# Patient Record
Sex: Male | Born: 1950 | Race: Black or African American | Hispanic: No | Marital: Married | Smoking: Never smoker
Health system: Southern US, Community
[De-identification: ages and names within clinical notes are randomized; demographics above are authoritative.]

## PROBLEM LIST (undated history)

## (undated) DIAGNOSIS — E119 Type 2 diabetes mellitus without complications: Secondary | ICD-10-CM

---

## 2017-12-22 ENCOUNTER — Encounter: Payer: Self-pay | Admitting: Emergency Medicine

## 2017-12-22 ENCOUNTER — Inpatient Hospital Stay
Admission: EM | Admit: 2017-12-22 | Discharge: 2017-12-25 | DRG: 563 | Disposition: A | Discharge status: Other Acute Inpatient Hospital | Payer: Medicare Other | Attending: Orthopedic Surgery | Admitting: Orthopedic Surgery

## 2017-12-22 ENCOUNTER — Inpatient Hospital Stay: Payer: Medicare Other

## 2017-12-22 ENCOUNTER — Emergency Department: Payer: Medicare Other

## 2017-12-22 ENCOUNTER — Other Ambulatory Visit: Payer: Self-pay

## 2017-12-22 DIAGNOSIS — Z01810 Encounter for preprocedural cardiovascular examination: Secondary | ICD-10-CM | POA: Diagnosis not present

## 2017-12-22 DIAGNOSIS — S82102A Unspecified fracture of upper end of left tibia, initial encounter for closed fracture: Principal | ICD-10-CM | POA: Diagnosis present

## 2017-12-22 DIAGNOSIS — S82142A Displaced bicondylar fracture of left tibia, initial encounter for closed fracture: Secondary | ICD-10-CM | POA: Diagnosis present

## 2017-12-22 DIAGNOSIS — S82402A Unspecified fracture of shaft of left fibula, initial encounter for closed fracture: Secondary | ICD-10-CM

## 2017-12-22 DIAGNOSIS — Z7984 Long term (current) use of oral hypoglycemic drugs: Secondary | ICD-10-CM | POA: Diagnosis not present

## 2017-12-22 DIAGNOSIS — E119 Type 2 diabetes mellitus without complications: Secondary | ICD-10-CM | POA: Diagnosis present

## 2017-12-22 DIAGNOSIS — Z880 Allergy status to penicillin: Secondary | ICD-10-CM | POA: Diagnosis not present

## 2017-12-22 DIAGNOSIS — S82202A Unspecified fracture of shaft of left tibia, initial encounter for closed fracture: Secondary | ICD-10-CM

## 2017-12-22 DIAGNOSIS — Z79899 Other long term (current) drug therapy: Secondary | ICD-10-CM | POA: Diagnosis not present

## 2017-12-22 HISTORY — DX: Type 2 diabetes mellitus without complications: E11.9

## 2017-12-22 LAB — CBC WITH DIFFERENTIAL/PLATELET
Basophils Absolute: 0.1 10*3/uL (ref 0–0.1)
Basophils Relative: 1 %
Eosinophils Absolute: 0 10*3/uL (ref 0–0.7)
Eosinophils Relative: 1 %
HEMATOCRIT: 41.7 % (ref 40.0–52.0)
Hemoglobin: 13.8 g/dL (ref 13.0–18.0)
LYMPHS PCT: 17 %
Lymphs Abs: 1.3 10*3/uL (ref 1.0–3.6)
MCH: 25.8 pg — AB (ref 26.0–34.0)
MCHC: 33.1 g/dL (ref 32.0–36.0)
MCV: 77.9 fL — AB (ref 80.0–100.0)
MONO ABS: 0.4 10*3/uL (ref 0.2–1.0)
MONOS PCT: 5 %
Neutro Abs: 5.9 10*3/uL (ref 1.4–6.5)
Neutrophils Relative %: 76 %
Platelets: 185 10*3/uL (ref 150–440)
RBC: 5.35 MIL/uL (ref 4.40–5.90)
RDW: 14.6 % — AB (ref 11.5–14.5)
WBC: 7.8 10*3/uL (ref 3.8–10.6)

## 2017-12-22 LAB — BASIC METABOLIC PANEL
Anion gap: 10 (ref 5–15)
BUN: 13 mg/dL (ref 8–23)
CALCIUM: 9.1 mg/dL (ref 8.9–10.3)
CO2: 26 mmol/L (ref 22–32)
Chloride: 102 mmol/L (ref 98–111)
Creatinine, Ser: 0.99 mg/dL (ref 0.61–1.24)
GFR calc non Af Amer: >60 mL/min (ref >60)
Glucose, Bld: 153 mg/dL — ABNORMAL HIGH (ref 70–99)
Potassium: 4.2 mmol/L (ref 3.5–5.1)
SODIUM: 138 mmol/L (ref 135–145)

## 2017-12-22 LAB — GLUCOSE, CAPILLARY
GLUCOSE-CAPILLARY: 161 mg/dL — AB (ref 70–99)
Glucose-Capillary: 102 mg/dL — ABNORMAL HIGH (ref 70–99)

## 2017-12-22 LAB — SURGICAL PCR SCREEN
MRSA, PCR: NEGATIVE
Staphylococcus aureus: POSITIVE — AB

## 2017-12-22 LAB — URINALYSIS, ROUTINE W REFLEX MICROSCOPIC
Bilirubin Urine: NEGATIVE
GLUCOSE, UA: NEGATIVE mg/dL
HGB URINE DIPSTICK: NEGATIVE
Ketones, ur: NEGATIVE mg/dL
Leukocytes, UA: NEGATIVE
Nitrite: NEGATIVE
PH: 6 (ref 5.0–8.0)
Protein, ur: NEGATIVE mg/dL
Specific Gravity, Urine: 1.018 (ref 1.005–1.030)

## 2017-12-22 LAB — TYPE AND SCREEN
ABO/RH(D): B POS
Antibody Screen: NEGATIVE

## 2017-12-22 MED ORDER — INFLUENZA VAC SPLIT HIGH-DOSE 0.5 ML IM SUSY
0.5000 mL | PREFILLED_SYRINGE | INTRAMUSCULAR | Status: DC
  Filled 2017-12-22: qty 0.5

## 2017-12-22 MED ORDER — LACTATED RINGERS IV SOLN
INTRAVENOUS | Status: DC
  Administered 2017-12-22 – 2017-12-25 (×5): via INTRAVENOUS

## 2017-12-22 MED ORDER — DOCUSATE SODIUM 100 MG PO CAPS
100.0000 mg | ORAL_CAPSULE | Freq: Two times a day (BID) | ORAL | Status: DC
  Administered 2017-12-22 – 2017-12-24 (×5): 100 mg via ORAL
  Filled 2017-12-22 (×5): qty 1

## 2017-12-22 MED ORDER — MAGNESIUM CITRATE PO SOLN
1.0000 | Freq: Once | ORAL | Status: DC | PRN
  Filled 2017-12-22: qty 296

## 2017-12-22 MED ORDER — MORPHINE SULFATE (PF) 2 MG/ML IV SOLN
0.5000 mg | INTRAVENOUS | Status: DC | PRN
  Administered 2017-12-23 – 2017-12-24 (×3): 0.5 mg via INTRAVENOUS
  Filled 2017-12-22 (×3): qty 1

## 2017-12-22 MED ORDER — FAMOTIDINE 20 MG PO TABS
10.0000 mg | ORAL_TABLET | Freq: Every day | ORAL | Status: DC
  Administered 2017-12-23 – 2017-12-24 (×2): 10 mg via ORAL
  Filled 2017-12-22 (×2): qty 1

## 2017-12-22 MED ORDER — INSULIN ASPART 100 UNIT/ML ~~LOC~~ SOLN
0.0000 [IU] | Freq: Three times a day (TID) | SUBCUTANEOUS | Status: DC
  Administered 2017-12-23 – 2017-12-24 (×6): 2 [IU] via SUBCUTANEOUS
  Administered 2017-12-25: 3 [IU] via SUBCUTANEOUS
  Filled 2017-12-22 (×7): qty 1

## 2017-12-22 MED ORDER — BISACODYL 10 MG RE SUPP: 10.0000 mg | Freq: Every day | RECTAL | Status: DC | PRN

## 2017-12-22 MED ORDER — CHLORHEXIDINE GLUCONATE CLOTH 2 % EX PADS
6.0000 | MEDICATED_PAD | Freq: Every day | CUTANEOUS | Status: DC
  Administered 2017-12-23 – 2017-12-24 (×2): 6 via TOPICAL

## 2017-12-22 MED ORDER — SENNA 8.6 MG PO TABS
1.0000 | ORAL_TABLET | Freq: Two times a day (BID) | ORAL | Status: DC
  Administered 2017-12-22 – 2017-12-24 (×5): 8.6 mg via ORAL
  Filled 2017-12-22 (×6): qty 1

## 2017-12-22 MED ORDER — AMLODIPINE BESYLATE 10 MG PO TABS
10.0000 mg | ORAL_TABLET | Freq: Every day | ORAL | Status: DC
  Administered 2017-12-23 – 2017-12-25 (×3): 10 mg via ORAL
  Filled 2017-12-22 (×3): qty 1

## 2017-12-22 MED ORDER — MUPIROCIN 2 % EX OINT
1.0000 "application " | TOPICAL_OINTMENT | Freq: Two times a day (BID) | CUTANEOUS | Status: DC
  Administered 2017-12-23 – 2017-12-24 (×3): 1 via NASAL
  Filled 2017-12-22 (×2): qty 22

## 2017-12-22 MED ORDER — METFORMIN HCL 500 MG PO TABS
500.0000 mg | ORAL_TABLET | Freq: Two times a day (BID) | ORAL | Status: DC
  Administered 2017-12-22 – 2017-12-25 (×6): 500 mg via ORAL
  Filled 2017-12-22 (×8): qty 1

## 2017-12-22 MED ORDER — CLINDAMYCIN PHOSPHATE 900 MG/50ML IV SOLN
900.0000 mg | INTRAVENOUS | Status: DC
  Filled 2017-12-22: qty 50

## 2017-12-22 MED ORDER — HYDROCODONE-ACETAMINOPHEN 5-325 MG PO TABS
1.0000 | ORAL_TABLET | Freq: Four times a day (QID) | ORAL | Status: DC | PRN
  Administered 2017-12-22 – 2017-12-25 (×11): 2 via ORAL
  Filled 2017-12-22 (×11): qty 2

## 2017-12-22 MED ORDER — HYDROMORPHONE HCL 1 MG/ML IJ SOLN
1.0000 mg | Freq: Once | INTRAMUSCULAR | Status: AC
  Administered 2017-12-22: 1 mg via INTRAMUSCULAR
  Filled 2017-12-22: qty 1

## 2017-12-22 NOTE — ED Provider Notes (Signed)
[facility] Emergency Department Provider Note   ____________________________________________   First MD Initiated Contact with Patient 12/22/17 1027     (approximate)  I have reviewed the triage vital signs and the nursing notes.   HISTORY  Chief Complaint Knee Pain    HPI George Booker is a 67 y.o. male patient arrived via EMS complaining of left knee pain secondary to rolled out of a golf cart.  Patient unable to bear light weight.  Patient rates his pain as a 8/10.  There is mild edema inferior aspect of the patella.  Past Medical History:  Diagnosis Date  . Diabetes mellitus without complication [facility])     Patient Active Problem List   Diagnosis Date Noted  . Tibial plateau fracture, left, closed, initial encounter 12/22/2017  . Fracture of left tibial plateau 12/22/2017    History reviewed. No pertinent surgical history.  Prior to Admission medications   Medication Sig Start Date End Date Taking? Authorizing Provider  amLODipine (NORVASC) 10 MG tablet Take 10 mg by mouth daily.   Yes [provider]  metFORMIN (GLUCOPHAGE) 500 MG tablet Take by mouth 2 (two) times daily with a meal.   Yes [provider]  ranitidine (ZANTAC) 150 MG tablet Take 150 mg by mouth 2 (two) times daily.   Yes [provider]    Allergies Penicillins  History reviewed. No pertinent family history.  Social History Social History   Tobacco Use  . Smoking status: Never Smoker  . Smokeless tobacco: Never Used  Substance Use Topics  . Alcohol use: Not on file  . Drug use: Not on file    Review of Systems  Constitutional: No fever/chills Eyes: No visual changes. ENT: No sore throat. Cardiovascular: Denies chest pain. Respiratory: Denies shortness of breath. Gastrointestinal: No abdominal pain.  No nausea, no vomiting.  No diarrhea.  No constipation. Genitourinary: Negative for dysuria. Musculoskeletal: Left knee  pain.. Skin: Negative for rash. Neurological: Negative for headaches, focal weakness or numbness. Endocrine:Diabetes and hypertension. Allergic/Immunilogical: Penicillin ____________________________________________   PHYSICAL EXAM:  VITAL SIGNS: ED Triage Vitals  Enc Vitals Group     BP 12/22/17 1011 (!) 144/82     Pulse Rate 12/22/17 1011 69     Resp 12/22/17 1011 20     Temp 12/22/17 1011 98.1 F (36.7 C)     Temp Source 12/22/17 1011 Oral     SpO2 12/22/17 1010 97 %     Weight 12/22/17 1011 216 lb (98 kg)     Height 12/22/17 1011 5\' 7"  (1.702 m)     Head Circumference --      Peak Flow --      Pain Score 12/22/17 1011 8     Pain Loc --      Pain Edu? --      Excl. in GC? --    Constitutional: Alert and oriented. Well appearing and in no acute distress. Neck: No cervical spine tenderness to palpation. Hematological/Lymphatic/Immunilogical No cervical lymphadenopathy. Cardiovascular: Normal rate, regular rhythm. Grossly normal heart sounds.  Good peripheral circulation. Respiratory: Normal respiratory effort.  No retractions. Lungs CTAB. Gastrointestinal: Soft and nontender. No distention. No abdominal bruits. No CVA tenderness. Musculoskeletal: No obvious deformity to the left knee.  Moderate edema.  Patient is tender to palpation in the tibial tuberosity. Neurologic:  Normal speech and language. No gross focal neurologic deficits are appreciated. No gait instability. Skin:  Skin is warm, dry and intact. No rash noted. Psychiatric: Mood  and affect are normal. Speech and behavior are normal.  ____________________________________________   LABS (all labs ordered are listed, but only abnormal results are displayed)  Labs Reviewed  CBC WITH DIFFERENTIAL/PLATELET - Abnormal; Notable for the following components:      Result Value   MCV 77.9 (*)    MCH 25.8 (*)    RDW 14.6 (*)    All other components within normal limits  BASIC METABOLIC PANEL    ____________________________________________  EKG   ____________________________________________  RADIOLOGY  ED MD interpretation:    Official radiology report(s): Dg Knee Complete 4 Views Left  Result Date: 12/22/2017 CLINICAL DATA:  Left knee pain after falling out of golf cart. EXAM: LEFT KNEE - COMPLETE 4+ VIEW COMPARISON:  None. FINDINGS: Severely displaced and comminuted fracture is seen involving the lateral tibial plateau. Mildly displaced proximal left fibular fracture is noted. Patella spurring is noted. Fat fluid level is noted in the suprapatellar bursa consistent with fracture. IMPRESSION: Severely displaced and comminuted lateral tibial plateau fracture. Mildly displaced proximal left fibular fracture. Electronically Signed   By: Lupita Raider, M.D.   On: 12/22/2017 10:51    ____________________________________________   PROCEDURES  Procedure(s) performed: None  Procedures  Critical Care performed: No  ____________________________________________   INITIAL IMPRESSION / ASSESSMENT AND PLAN / ED COURSE  As part of my medical decision making, I reviewed the following data within the electronic MEDICAL RECORD NUMBER    Left knee pain secondary to proximal tib-fib fracture.  Orthopedic has been consulted and patient admitted for surgery.   ____________________________________________   FINAL CLINICAL IMPRESSION(S) / ED DIAGNOSES  Final diagnoses:  Tibia/fibula fracture, left, closed, initial encounter     ED Discharge Orders    None       Note:  This document was prepared using Dragon voice recognition software and may include unintentional dictation errors.    Joni Reining, PA-C 12/22/17 1317    Emily Filbert, MD 12/22/17 [phone]

## 2017-12-22 NOTE — ED Triage Notes (Signed)
Pt arrived via EMS with left knee pain after rolling out of golf cart.

## 2017-12-22 NOTE — Progress Notes (Signed)
Received order from Dr. Odis Luster to use polar care to left knee instead of ice pack

## 2017-12-22 NOTE — ED Notes (Signed)
FIRST NURSE NOTE:  Pt arrived via EMS after rolling out of golf cart on the left knee, swelling noted to knee, was given a total of 100 mcg of Fentanyl IM with EMS 50 mcg at 942 and at 949.   Unable to get IV access, BP 181/98, p-63, RR-17, 97% RA.

## 2017-12-22 NOTE — Progress Notes (Signed)
Surgical MRSA PCR sent  

## 2017-12-22 NOTE — Clinical Social Work Note (Addendum)
Clinical Social Work Assessment  Patient Details  Name: George Booker MRN: [id number] Date of Birth: Nov 03, 1950  Date of referral:  12/22/17               Reason for consult:  Facility Placement                Permission sought to share information with:  Family Supports, Customer service manager Permission granted to share information::  Yes, Verbal Permission Granted  Name::     Spouse wife George Booker [phone]  Agency::  All facilities  Relationship::     Contact Information:     Housing/Transportation Living arrangements for the past 2 months:  [place] of Information:  Patient Patient Interpreter Needed:  None Criminal Activity/Legal Involvement Pertinent to Current Situation/Hospitalization:  No - Comment as needed Significant Relationships:  None Lives with:  Adult Children, Minor Children, Spouse Do you feel safe going back to the place where you live?  Yes Need for family participation in patient care:  Yes (Comment)  Care giving concerns:  TBD- Patient has no concerns at this time  Facilities manager / plan: LCSW introduced myself to patient and obtained verbal consent to speak to spouse and is agreeable to either return home with home health or go to SNF for short term rehab. He will await his surgeons opinion and family discussion. He is married and has a daughter and grand daughter living with him and his wife ( excellent family support.) Patient will require assistance with most of his ADLs after surgery with the exception of feeding himself. He is medicare and [place] benefits. He has Tibeal plateau fracture on his left knee and will need support  And obtainPT and OT after his surgery. Patient will follow the surgeons recommendations. LCSW completed assessment and Fl2 started and obtained a Passr number in case he elects to go to facility. He wants to talk to home health as well. This would be his first option.( to go home) patient is  diabetic and takes met formin    Employment status:  Retired(Retired Retail banker and Physiological scientist) Insurance information:  Medicare, [place] Benefit PT Recommendations:  Not assessed at this time Information / Referral to community resources:  [place]  Patient/Family's Response to care:  Good understanding of process  Patient/Family's Understanding of and Emotional Response to Diagnosis, Current Treatment, and Prognosis:  He has very good understanding and patient requested 2 warm blankets which LCSW provided.  Emotional Assessment Appearance:  Appears stated age Attitude/Demeanor/Rapport:  Engaged, Charismatic Affect (typically observed):  Accepting, Calm Orientation:  Oriented to Self, Oriented to Place, Oriented to  Time, Oriented to Situation Alcohol / Substance use:  Not Applicable Psych involvement (Current and /or in the community):  No (Comment)  Discharge Needs  Concerns to be addressed:  No discharge needs identified Readmission within the last 30 days:  No Current discharge risk:  None Barriers to Discharge:  Continued Medical Work up   [place], [place], LCSW 12/22/2017, 1:35 PM

## 2017-12-22 NOTE — ED Notes (Signed)
See triage note  Presents with left knee pain  States he rolled out of golf cart  Swelling

## 2017-12-22 NOTE — ED Notes (Signed)
Pt transported to 156

## 2017-12-22 NOTE — Progress Notes (Signed)
LCSW went to speak to patient about his future medical needs and was informed by ED nurse that he was at CT. He is going to be admitted to room 156. LCSW will follow patient and complete assessment.  [employer] LCSW [phone]

## 2017-12-22 NOTE — NC FL2 (Signed)
  [place] MEDICAID FL2 LEVEL OF CARE SCREENING TOOL     IDENTIFICATION  Patient Name: George Booker Birthdate: Jul 22, 1950 Sex: male Admission Date (Current Location): 12/22/2017  [place] and [place] Number:  Chiropodist and Address:  [facility], [address], [place], [place] [address]      Provider Number: [id number]  Attending Physician Name and Address:  Lyndle Herrlich, MD  Relative Name and Phone Number:     Jennie Bolar  [phone] ( spouse)  Current Level of Care: Hospital Recommended Level of Care: Skilled Nursing Facility Prior Approval Number:    Date Approved/Denied:   PASRR Number: [id number]  Discharge Plan: SNF    Current Diagnoses: Patient Active Problem List   Diagnosis Date Noted  . Tibial plateau fracture, left, closed, initial encounter 12/22/2017  . Fracture of left tibial plateau 12/22/2017    Orientation RESPIRATION BLADDER Height & Weight     Self, Time, Situation, Place  Normal Continent Weight: 216 lb (98 kg) Height:  5\' 7"  (170.2 cm)  BEHAVIORAL SYMPTOMS/MOOD NEUROLOGICAL BOWEL NUTRITION STATUS      Continent Diet(Diabetic)  AMBULATORY STATUS COMMUNICATION OF NEEDS Skin   Extensive Assist Verbally (S) (After surgery may have surgical wounds)                       Personal Care Assistance Level of Assistance  Bathing, Feeding, Dressing, Total care Bathing Assistance: Limited assistance Feeding assistance: Limited assistance Dressing Assistance: Limited assistance Total Care Assistance: Limited assistance   Functional Limitations Info  Sight, Hearing, Speech Sight Info: Adequate Hearing Info: Adequate Speech Info: Adequate    SPECIAL CARE FACTORS FREQUENCY  PT (By licensed PT), OT (By licensed OT)     PT Frequency: x5 OT Frequency: x5            Contractures Contractures Info: Not present    Additional Factors Info  Code Status, Allergies Code Status Info: Full  code Allergies Info: Penicillins           Current Medications (12/22/2017):  This is the current hospital active medication list Current Facility-Administered Medications  Medication Dose Route Frequency Provider Last Rate Last Dose  . HYDROcodone-acetaminophen (NORCO/VICODIN) 5-325 MG per tablet 1-2 tablet  1-2 tablet Oral Q6H PRN Lyndle Herrlich, MD   2 tablet at 12/22/17 1316  . morphine 2 MG/ML injection 0.5 mg  0.5 mg Intravenous Q2H PRN Lyndle Herrlich, MD       Current Outpatient Medications  Medication Sig Dispense Refill  . amLODipine (NORVASC) 10 MG tablet Take 10 mg by mouth daily.    . metFORMIN (GLUCOPHAGE) 500 MG tablet Take by mouth 2 (two) times daily with a meal.    . ranitidine (ZANTAC) 150 MG tablet Take 150 mg by mouth 2 (two) times daily.       Discharge Medications: Please see discharge summary for a list of discharge medications.  Relevant Imaging Results:  Relevant Lab Results:   Additional Information SSN [id number]  Cheron Schaumann, [place]

## 2017-12-22 NOTE — Progress Notes (Signed)
ADMISSION NOTE: Pt. admitted to unit 156 from ED, Oriented to room, call bell, Ascom phones and staff. Bed in low position. Fall safety plan reviewed, non-skid socks in place, bed alarm on. Full assessment to Epic; skin assessed with Marijean Niemann, RN .Will continue to monitor.

## 2017-12-22 NOTE — Progress Notes (Signed)
Pt has a history of diabetes, no sliding scale orders. MD Odis Luster notified and placing orders. Pts BP in the 160's MD also notified. No orders received.

## 2017-12-23 ENCOUNTER — Encounter
Admission: EM | Disposition: A | Discharge status: Other Acute Inpatient Hospital | Payer: Self-pay | Source: Home / Self Care | Attending: Orthopedic Surgery

## 2017-12-23 LAB — URINE CULTURE: Culture: NO GROWTH

## 2017-12-23 LAB — GLUCOSE, CAPILLARY
Glucose-Capillary: 133 mg/dL — ABNORMAL HIGH (ref 70–99)
Glucose-Capillary: 137 mg/dL — ABNORMAL HIGH (ref 70–99)
Glucose-Capillary: 135 mg/dL — ABNORMAL HIGH (ref 70–99)
Glucose-Capillary: 149 mg/dL — ABNORMAL HIGH (ref 70–99)

## 2017-12-23 LAB — HIV ANTIBODY (ROUTINE TESTING W REFLEX): HIV SCREEN 4TH GENERATION: NONREACTIVE

## 2017-12-23 SURGERY — OPEN REDUCTION INTERNAL FIXATION (ORIF) TIBIAL PLATEAU
Anesthesia: Choice | Laterality: Left

## 2017-12-23 NOTE — Progress Notes (Signed)
Patient desires to be transferred to the [facility] for treatment. Orders placed.

## 2017-12-23 NOTE — H&P (Signed)
PREOPERATIVE H&P  Chief Complaint: left tibial plateau fracture  HPI: George Booker is a 67 y.o. male who presents for preoperative history and physical with a diagnosis of left tibial plateau fracture. Symptoms are rated as moderate to severe, and have been worsening.  This is significantly impairing activities of daily living.  He has elected for surgical management.   Past Medical History:  Diagnosis Date  . Diabetes mellitus without complication (HCC)    History reviewed. No pertinent surgical history. Social History   Socioeconomic History  . Marital status: Married    Spouse name: Not on file  . Number of children: Not on file  . Years of education: Not on file  . Highest education level: Not on file  Occupational History  . Not on file  Social Needs  . Financial resource strain: Not on file  . Food insecurity:    Worry: Not on file    Inability: Not on file  . Transportation needs:    Medical: Not on file    Non-medical: Not on file  Tobacco Use  . Smoking status: Never Smoker  . Smokeless tobacco: Never Used  Substance and Sexual Activity  . Alcohol use: Not on file  . Drug use: Not on file  . Sexual activity: Not on file  Lifestyle  . Physical activity:    Days per week: Not on file    Minutes per session: Not on file  . Stress: Not on file  Relationships  . Social connections:    Talks on phone: Not on file    Gets together: Not on file    Attends religious service: Not on file    Active member of club or organization: Not on file    Attends meetings of clubs or organizations: Not on file    Relationship status: Not on file  Other Topics Concern  . Not on file  Social History Narrative  . Not on file   History reviewed. No pertinent family history. Allergies  Allergen Reactions  . Penicillins     Has patient had a PCN reaction causing immediate rash, facial/tongue/throat swelling, SOB or lightheadedness with hypotension: Unknown Has patient had a  PCN reaction causing severe rash involving mucus membranes or skin necrosis: Unknown Has patient had a PCN reaction that required hospitalization: Unknown Has patient had a PCN reaction occurring within the last 10 years: Unknown If all of the above answers are "NO", then may proceed with Cephalosporin use.   Prior to Admission medications   Medication Sig Start Date End Date Taking? Authorizing Provider  amLODipine (NORVASC) 10 MG tablet Take 10 mg by mouth daily.   Yes [provider]  ibuprofen (ADVIL,MOTRIN) 800 MG tablet Take 800 mg by mouth daily as needed.   Yes [provider]  metFORMIN (GLUCOPHAGE) 500 MG tablet Take by mouth 2 (two) times daily with a meal.   Yes [provider]  omeprazole (PRILOSEC) 20 MG capsule Take 20 mg by mouth daily.   Yes [provider]     Positive ROS: All other systems have been reviewed and were otherwise negative with the exception of those mentioned in the HPI and as above.  Physical Exam: General: Alert, no acute distress Cardiovascular: Regular rate and rhythm, no murmurs rubs or gallops.  No pedal edema Respiratory: Clear to auscultation bilaterally, no wheezes rales or rhonchi. No cyanosis, no use of accessory musculature GI: No organomegaly, abdomen is soft and non-tender nondistended with positive bowel sounds.  Skin: Skin intact, no lesions within the operative field. Neurologic: Sensation intact distally Psychiatric: Patient is competent for consent with normal mood and affect Lymphatic: No axillary or cervical lymphadenopathy  MUSCULOSKELETAL: +DF/PF/EHL, sensation intact, compartments soft, knee with medial and lateral tenderness, large effusion  Assessment: left tibial plateau fracture  Plan: Plan for Procedure(s): OPEN REDUCTION INTERNAL FIXATION (ORIF) TIBIAL PLATEAU, LEFT  I discussed the risks and benefits of surgery. The risks include but are not limited to infection, bleeding requiring  blood transfusion, nerve or blood vessel injury, joint stiffness or loss of motion, persistent pain, weakness or instability, malunion, nonunion and hardware failure and the need for further surgery. Medical risks include but are not limited to DVT and pulmonary embolism, myocardial infarction, stroke, pneumonia, respiratory failure and death. Patient understood these risks and wished to proceed.   Lyndle Herrlich, MD   12/23/2017 11:36 AM

## 2017-12-23 NOTE — Care Management (Signed)
Transfer packet given to primary RN to have Dr. Odis Luster sign then fax to [facility]. Per Steward Drone at the [facility], patient can transfer over the weekend if bed becomes available.

## 2017-12-23 NOTE — OR Nursing (Signed)
Md cancelled case. States that pt is to be transferred to the [facility] hospital.

## 2017-12-23 NOTE — Care Management (Signed)
Spoke with Steward Drone at [facility], she is to fax transfer paperwork to [facility] that needs to be completed and SIGNED by physician prior to transfer

## 2017-12-24 LAB — GLUCOSE, CAPILLARY
GLUCOSE-CAPILLARY: 150 mg/dL — AB (ref 70–99)
GLUCOSE-CAPILLARY: 142 mg/dL — AB (ref 70–99)
Glucose-Capillary: 137 mg/dL — ABNORMAL HIGH (ref 70–99)
Glucose-Capillary: 176 mg/dL — ABNORMAL HIGH (ref 70–99)

## 2017-12-24 MED ORDER — HYDRALAZINE HCL 20 MG/ML IJ SOLN
10.0000 mg | Freq: Four times a day (QID) | INTRAMUSCULAR | Status: DC | PRN
  Administered 2017-12-24 – 2017-12-25 (×2): 10 mg via INTRAVENOUS
  Filled 2017-12-24 (×2): qty 1

## 2017-12-24 NOTE — Progress Notes (Signed)
[facility] bed control called for bed request. Per Dr Odis Luster, [facility] VS denied patient but states [facility] trauma will take him. Bed request placed.

## 2017-12-24 NOTE — Progress Notes (Signed)
Dr Odis Luster to fill out [place]/Medical Necessity forms, and then we can call the [facility] transferring service for transport at [phone].

## 2017-12-24 NOTE — Progress Notes (Signed)
Katie from [place] transfer center called, they have accepted the patient. They will call back when a bed becomes available.

## 2017-12-24 NOTE — Progress Notes (Signed)
Spoke with [facility] transport and [facility] both companies are not able to transport pt to [facility].

## 2017-12-24 NOTE — Progress Notes (Signed)
Report called to [facility] [phone]).

## 2017-12-24 NOTE — Progress Notes (Signed)
Subjective:  Patient reports pain as moderate.  No other complaints.  Objective:   VITALS:   Vitals:   12/23/17 0806 12/24/17 0047 12/24/17 0431 12/24/17 0747  BP: (!) 158/99 (!) 179/105 (!) 158/86 (!) 172/85  Pulse: 78 75 73 82  Resp: 18 18  18   Temp: 98.1 F (36.7 C) 98.1 F (36.7 C)  98.7 F (37.1 C)  TempSrc: Oral Oral  Oral  SpO2: 99% 96%  95%  Weight:      Height:        PHYSICAL EXAM:  Neurovascular intact Sensation intact distally Dorsiflexion/Plantar flexion intact Compartment soft  LABS  Results for orders placed or performed during the hospital encounter of 12/22/17 (from the past 24 hour(s))  Glucose, capillary     Status: Abnormal   Collection Time: 12/23/17 12:11 PM  Result Value Ref Range   Glucose-Capillary 137 (H) 70 - 99 mg/dL   Comment 1 Notify RN   Glucose, capillary     Status: Abnormal   Collection Time: 12/23/17  4:59 PM  Result Value Ref Range   Glucose-Capillary 135 (H) 70 - 99 mg/dL   Comment 1 Notify RN   Glucose, capillary     Status: Abnormal   Collection Time: 12/23/17  9:28 PM  Result Value Ref Range   Glucose-Capillary 149 (H) 70 - 99 mg/dL  Glucose, capillary     Status: Abnormal   Collection Time: 12/24/17  7:49 AM  Result Value Ref Range   Glucose-Capillary 137 (H) 70 - 99 mg/dL   Comment 1 Notify RN     Ct Knee Left Wo Contrast  Result Date: 12/22/2017 CLINICAL DATA:  Status post fall golf cart. EXAM: CT OF THE LEFT KNEE WITHOUT CONTRAST TECHNIQUE: Multidetector CT imaging of the LEFT knee was performed according to the standard protocol. Multiplanar CT image reconstructions were also generated. COMPARISON:  None. FINDINGS: Bones/Joint/Cartilage Acute comminuted and depressed fracture of the lateral tibial plateau with the major fracture fragment including the lateral tibial plateau displaced inferiorly 15 mm, laterally 20 mm and posteriorly 20 mm. Fracture extends into the lateral tibial evidence. Mildly comminuted fracture  of the fibular head. No other fracture or dislocation. Large lipohemarthrosis.  No aggressive osseous lesion. Mild medial femorotibial compartment joint space narrowing. Moderate lateral patellofemoral compartment joint space narrowing with subchondral cystic changes. Ligaments Ligaments are suboptimally evaluated by CT. Muscles and Tendons Muscles are normal. No muscle atrophy. Quadriceps tendon and patellar tendon are intact. Soft tissue No fluid collection or hematoma.  No soft tissue mass. IMPRESSION: 1. Acute comminuted and depressed fracture of the lateral tibial plateau with the major fracture fragment including the lateral tibial plateau displaced inferiorly 15 mm, laterally 20 mm and posteriorly 20 mm (Schatzker II). Fracture extends into the lateral tibial evidence. 2. Mildly comminuted fracture of the fibular head. Electronically Signed   By: Elige Ko   On: 12/22/2017 13:49   Dg Knee Complete 4 Views Left  Result Date: 12/22/2017 CLINICAL DATA:  Left knee pain after falling out of golf cart. EXAM: LEFT KNEE - COMPLETE 4+ VIEW COMPARISON:  None. FINDINGS: Severely displaced and comminuted fracture is seen involving the lateral tibial plateau. Mildly displaced proximal left fibular fracture is noted. Patella spurring is noted. Fat fluid level is noted in the suprapatellar bursa consistent with fracture. IMPRESSION: Severely displaced and comminuted lateral tibial plateau fracture. Mildly displaced proximal left fibular fracture. Electronically Signed   By: Lupita Raider, M.D.   On: 12/22/2017 10:51  Assessment/Plan:     Active Problems:   Tibial plateau fracture, left, closed, initial encounter   Fracture of left tibial plateau   Continue to manage pain and swelling. Plan transfer to [facility] when bed available.    Lyndle Herrlich , MD 12/24/2017, 10:13 AM

## 2017-12-25 LAB — GLUCOSE, CAPILLARY: GLUCOSE-CAPILLARY: 153 mg/dL — AB (ref 70–99)

## 2017-12-25 NOTE — Discharge Summary (Signed)
Physician Discharge Summary  Patient ID: George Booker MRN: [id number] DOB/AGE: 69/26/1952 69 y.o.  Admit date: 12/22/2017 Discharge date: 12/25/2017  Admission Diagnoses:  left tibial plateau fracture Tibial plateau fracture, left, closed, initial encounter  Discharge Diagnoses:  left tibial plateau fracture Principal Problem:   Tibial plateau fracture, left, closed, initial encounter   Past Medical History:  Diagnosis Date  . Diabetes mellitus without complication (HCC)     Surgeries: Procedure(s): none   Consultants (if any):   Discharged Condition: Improved  Hospital Course: George Booker is an 67 y.o. male who was admitted 12/22/2017 with a diagnosis of  left tibial plateau fracture Tibial plateau fracture, left, closed, initial encounter and was treated symptomatically until a bed was available for transfer to [facility] for orthopedic specialty care.     Marland Kitchen  He was given sequential compression devices for DVT prophylaxis.  He benefited maximally from the hospital stay and there were no complications.    Recent vital signs:  Vitals:   12/25/17 0050 12/25/17 0729  BP: (!) 169/90 (!) 155/98  Pulse: (!) 110 (!) 124  Resp:  18  Temp:  98.2 F (36.8 C)  SpO2:  94%    Recent laboratory studies:  Lab Results  Component Value Date   HGB 13.8 12/22/2017   Lab Results  Component Value Date   WBC 7.8 12/22/2017   PLT 185 12/22/2017   No results found for: INR Lab Results  Component Value Date   NA 138 12/22/2017   K 4.2 12/22/2017   CL 102 12/22/2017   CO2 26 12/22/2017   BUN 13 12/22/2017   CREATININE 0.99 12/22/2017   GLUCOSE 153 (H) 12/22/2017    Discharge Medications:   Allergies as of 12/25/2017      Reactions   Penicillins    Has patient had a PCN reaction causing immediate rash, facial/tongue/throat swelling, SOB or lightheadedness with hypotension: Unknown Has patient had a PCN reaction causing severe rash involving mucus membranes or skin necrosis:  Unknown Has patient had a PCN reaction that required hospitalization: Unknown Has patient had a PCN reaction occurring within the last 10 years: Unknown If all of the above answers are "NO", then may proceed with Cephalosporin use.      Medication List    STOP taking these medications   ibuprofen 800 MG tablet Commonly known as:  ADVIL,MOTRIN     TAKE these medications   amLODipine 10 MG tablet Commonly known as:  NORVASC Take 10 mg by mouth daily.   metFORMIN 500 MG tablet Commonly known as:  GLUCOPHAGE Take by mouth 2 (two) times daily with a meal.   omeprazole 20 MG capsule Commonly known as:  PRILOSEC Take 20 mg by mouth daily.       Diagnostic Studies: Ct Knee Left Wo Contrast  Result Date: 12/22/2017 CLINICAL DATA:  Status post fall golf cart. EXAM: CT OF THE LEFT KNEE WITHOUT CONTRAST TECHNIQUE: Multidetector CT imaging of the LEFT knee was performed according to the standard protocol. Multiplanar CT image reconstructions were also generated. COMPARISON:  None. FINDINGS: Bones/Joint/Cartilage Acute comminuted and depressed fracture of the lateral tibial plateau with the major fracture fragment including the lateral tibial plateau displaced inferiorly 15 mm, laterally 20 mm and posteriorly 20 mm. Fracture extends into the lateral tibial evidence. Mildly comminuted fracture of the fibular head. No other fracture or dislocation. Large lipohemarthrosis.  No aggressive osseous lesion. Mild medial femorotibial compartment joint space narrowing. Moderate lateral patellofemoral compartment joint space narrowing  with subchondral cystic changes. Ligaments Ligaments are suboptimally evaluated by CT. Muscles and Tendons Muscles are normal. No muscle atrophy. Quadriceps tendon and patellar tendon are intact. Soft tissue No fluid collection or hematoma.  No soft tissue mass. IMPRESSION: 1. Acute comminuted and depressed fracture of the lateral tibial plateau with the major fracture fragment  including the lateral tibial plateau displaced inferiorly 15 mm, laterally 20 mm and posteriorly 20 mm (Schatzker II). Fracture extends into the lateral tibial evidence. 2. Mildly comminuted fracture of the fibular head. Electronically Signed   By: Elige Ko   On: 12/22/2017 13:49   Dg Knee Complete 4 Views Left  Result Date: 12/22/2017 CLINICAL DATA:  Left knee pain after falling out of golf cart. EXAM: LEFT KNEE - COMPLETE 4+ VIEW COMPARISON:  None. FINDINGS: Severely displaced and comminuted fracture is seen involving the lateral tibial plateau. Mildly displaced proximal left fibular fracture is noted. Patella spurring is noted. Fat fluid level is noted in the suprapatellar bursa consistent with fracture. IMPRESSION: Severely displaced and comminuted lateral tibial plateau fracture. Mildly displaced proximal left fibular fracture. Electronically Signed   By: Lupita Raider, M.D.   On: 12/22/2017 10:51    Disposition: Discharge disposition: [facility] Not Defined            Signed: Lyndle Herrlich ,MD 12/25/2017, 9:46 AM

## 2017-12-25 NOTE — Progress Notes (Signed)
[facility] transport service called, no trucks available to transport patient. They transferred me to [facility], and they will be here around 815 to transport patient to [facility] Rm 2301.

## 2019-06-19 IMAGING — CT CT KNEE*L* W/O CM
3 series · 14 of 33 positions shown, 17 images · non-contrast
Comparison: None.

CLINICAL DATA: Status post fall golf cart.

EXAM:
CT OF THE LEFT KNEE WITHOUT CONTRAST
TECHNIQUE: Multidetector CT imaging of the LEFT knee was performed according to
the standard protocol. Multiplanar CT image reconstructions were
also generated.

[Series 3: axial st · axial · 0.39mm/px · z∈[+618,+750]mm · 6 of 87 slices shown, 8 images]
[im 14/87  soft-tissue]
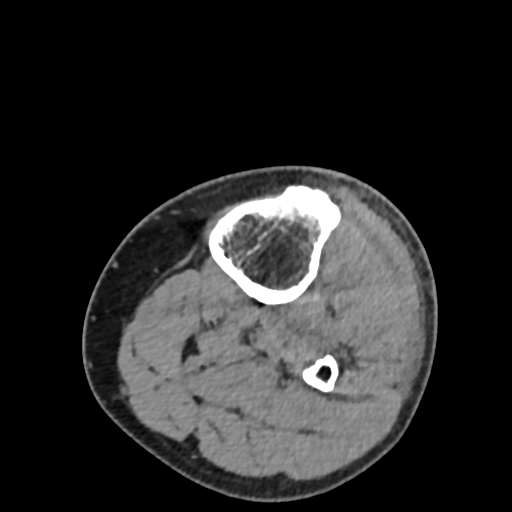
[im 14/87  bone]
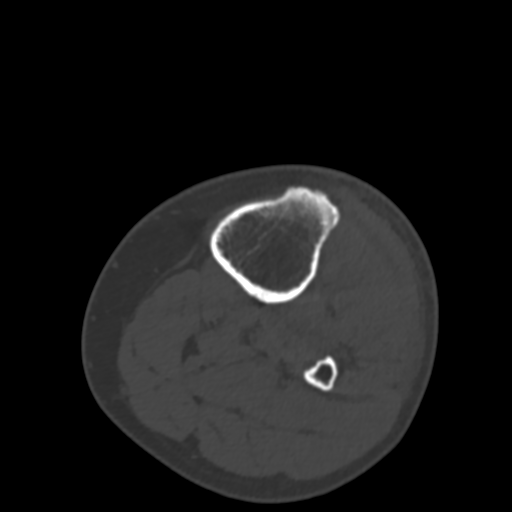
[im 27/87  bone]
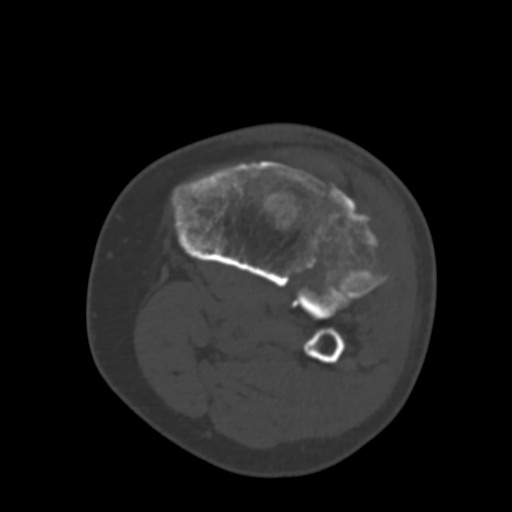
[im 40/87  bone]
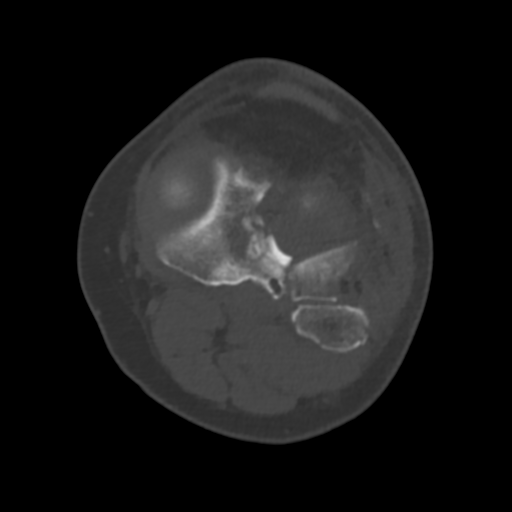
[im 53/87  bone]
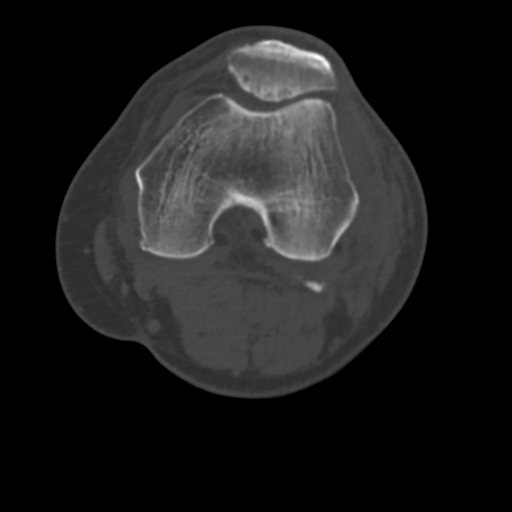
[im 67/87  soft-tissue]
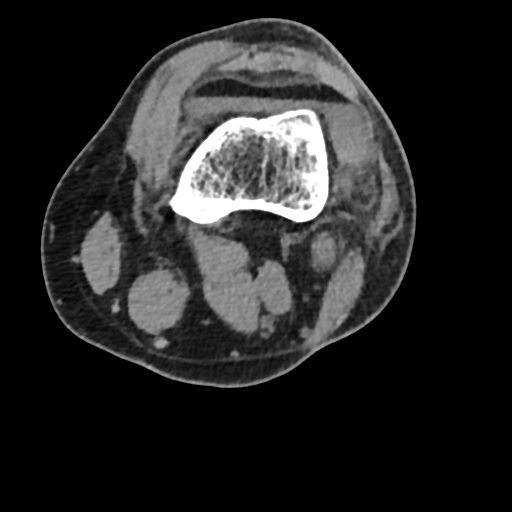
[im 67/87  bone]
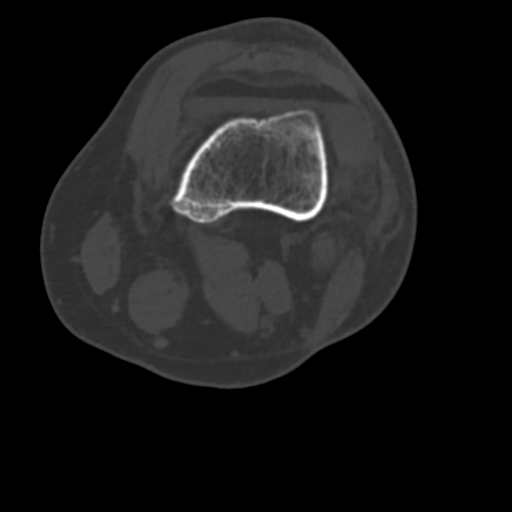
[im 80/87  bone]
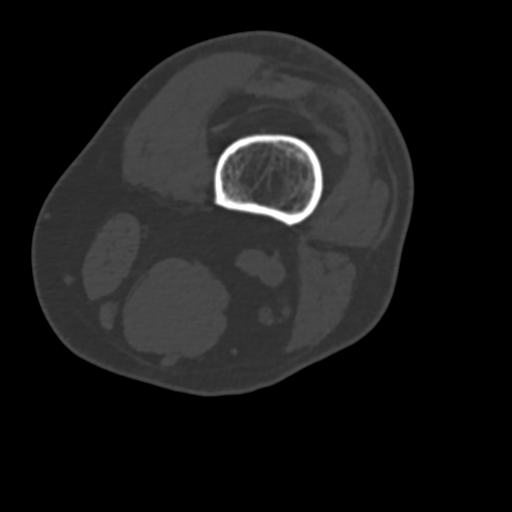

[Series 6: coronal st · coronal · 0.34mm/px · 3 of 78 slices shown]
[im 16/78  bone]
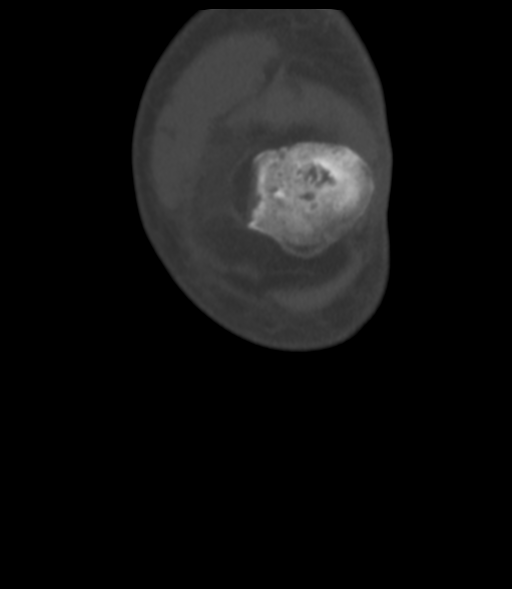
[im 31/78  bone]
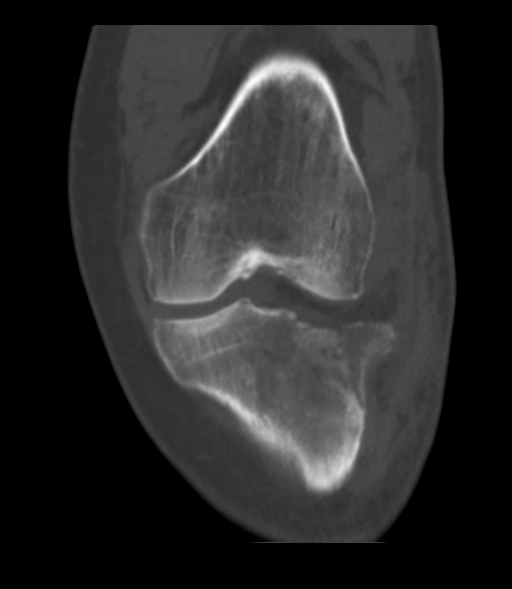
[im 47/78  bone]
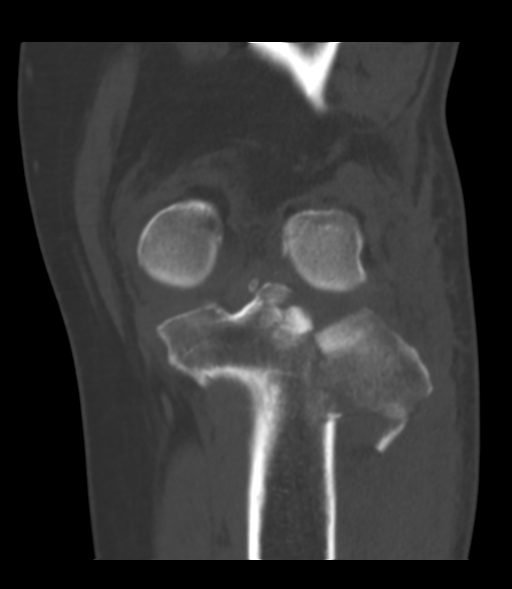

[Series 7: sagittal st · sagittal · 0.30mm/px · 5 of 72 slices shown, 6 images]
[im 24/72  bone]
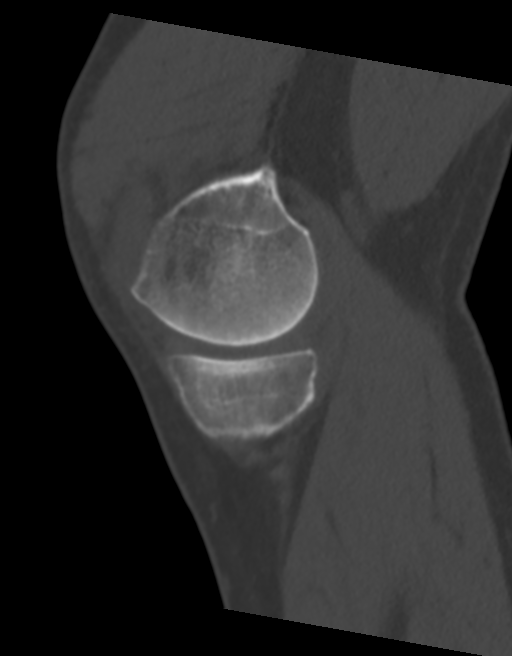
[im 30/72  bone]
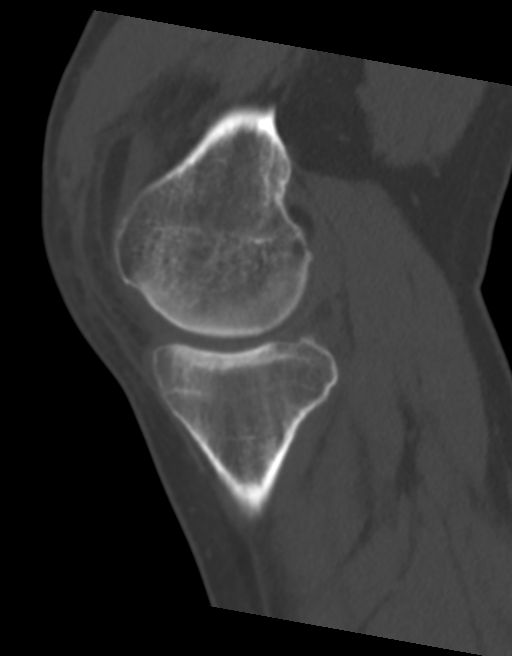
[im 36/72  soft-tissue]
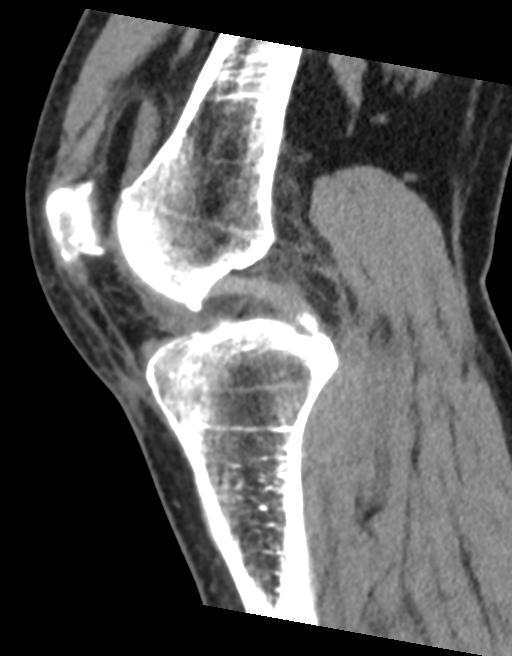
[im 36/72  bone]
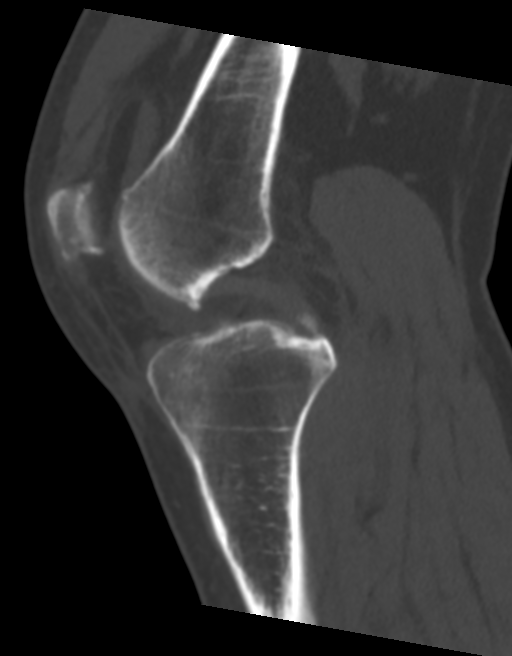
[im 42/72  bone]
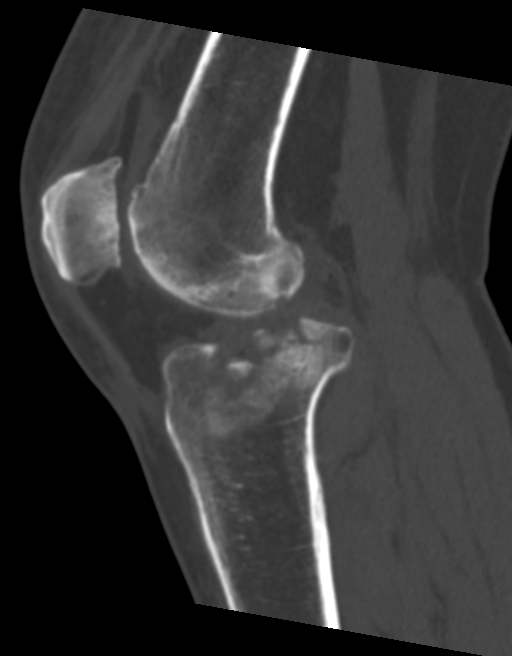
[im 48/72  bone]
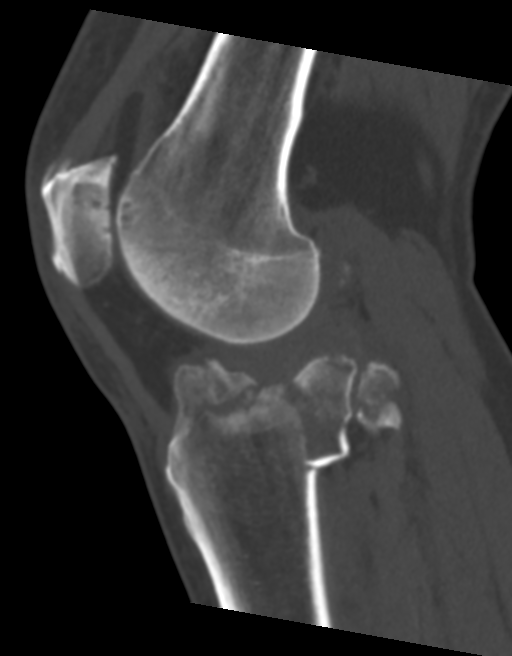

[14 of 33 positions shown; findings below may reference images not displayed]

FINDINGS: Bones/Joint/Cartilage

Acute comminuted and depressed fracture of the lateral tibial
plateau with the major fracture fragment including the lateral
tibial plateau displaced inferiorly 15 mm, laterally 20 mm and
posteriorly 20 mm. Fracture extends into the lateral tibial
evidence.

Mildly comminuted fracture of the fibular head.

No other fracture or dislocation.

Large lipohemarthrosis.  No aggressive osseous lesion.

Mild medial femorotibial compartment joint space narrowing. Moderate
lateral patellofemoral compartment joint space narrowing with
subchondral cystic changes.

Ligaments

Ligaments are suboptimally evaluated by CT.

Muscles and Tendons
Muscles are normal. No muscle atrophy. Quadriceps tendon and
patellar tendon are intact.

Soft tissue
No fluid collection or hematoma.  No soft tissue mass.
IMPRESSION: 1. Acute comminuted and depressed fracture of the lateral tibial
plateau with the major fracture fragment including the lateral
tibial plateau displaced inferiorly 15 mm, laterally 20 mm and
posteriorly 20 mm (Schatzker II). Fracture extends into the lateral
tibial evidence.
2. Mildly comminuted fracture of the fibular head.
